# Patient Record
Sex: Male | Born: 1955 | Race: Black or African American | Hispanic: No | Marital: Single | Smoking: Current every day smoker
Health system: Southern US, Community
[De-identification: ages and names within clinical notes are randomized; demographics above are authoritative.]

## PROBLEM LIST (undated history)

## (undated) DIAGNOSIS — I10 Essential (primary) hypertension: Secondary | ICD-10-CM

## (undated) DIAGNOSIS — H409 Unspecified glaucoma: Secondary | ICD-10-CM

## (undated) HISTORY — DX: Unspecified glaucoma: H40.9

## (undated) HISTORY — PX: EYE SURGERY: SHX253

---

## 2001-05-16 ENCOUNTER — Encounter: Admission: RE | Admit: 2001-05-16 | Discharge: 2001-05-16 | Payer: Self-pay | Admitting: Family Medicine

## 2001-05-16 ENCOUNTER — Encounter: Payer: Self-pay | Admitting: Family Medicine

## 2002-09-03 ENCOUNTER — Encounter: Payer: Self-pay | Admitting: Family Medicine

## 2002-09-03 ENCOUNTER — Encounter: Admission: RE | Admit: 2002-09-03 | Discharge: 2002-09-03 | Payer: Self-pay | Admitting: Family Medicine

## 2011-02-24 ENCOUNTER — Inpatient Hospital Stay (INDEPENDENT_AMBULATORY_CARE_PROVIDER_SITE_OTHER)
Admission: RE | Admit: 2011-02-24 | Discharge: 2011-02-24 | Disposition: A | Discharge status: Home / Self Care | Payer: Self-pay | Source: Ambulatory Visit | Attending: Family Medicine | Admitting: Family Medicine

## 2011-02-24 DIAGNOSIS — K047 Periapical abscess without sinus: Secondary | ICD-10-CM

## 2016-09-01 ENCOUNTER — Emergency Department (HOSPITAL_COMMUNITY): Payer: No Typology Code available for payment source

## 2016-09-01 ENCOUNTER — Emergency Department (HOSPITAL_COMMUNITY)
Admission: EM | Admit: 2016-09-01 | Discharge: 2016-09-01 | Disposition: A | Discharge status: Home / Self Care | Payer: No Typology Code available for payment source | Attending: Emergency Medicine | Admitting: Emergency Medicine

## 2016-09-01 ENCOUNTER — Encounter (HOSPITAL_COMMUNITY): Payer: Self-pay | Admitting: *Deleted

## 2016-09-01 DIAGNOSIS — I10 Essential (primary) hypertension: Secondary | ICD-10-CM | POA: Diagnosis not present

## 2016-09-01 DIAGNOSIS — F17200 Nicotine dependence, unspecified, uncomplicated: Secondary | ICD-10-CM | POA: Insufficient documentation

## 2016-09-01 DIAGNOSIS — Y939 Activity, unspecified: Secondary | ICD-10-CM | POA: Insufficient documentation

## 2016-09-01 DIAGNOSIS — G44311 Acute post-traumatic headache, intractable: Secondary | ICD-10-CM

## 2016-09-01 DIAGNOSIS — Y999 Unspecified external cause status: Secondary | ICD-10-CM | POA: Insufficient documentation

## 2016-09-01 DIAGNOSIS — S0990XA Unspecified injury of head, initial encounter: Secondary | ICD-10-CM | POA: Diagnosis present

## 2016-09-01 DIAGNOSIS — Y92410 Unspecified street and highway as the place of occurrence of the external cause: Secondary | ICD-10-CM | POA: Insufficient documentation

## 2016-09-01 HISTORY — DX: Essential (primary) hypertension: I10

## 2016-09-01 MED ORDER — ACETAMINOPHEN 325 MG PO TABS
650.0000 mg | ORAL_TABLET | Freq: Once | ORAL | Status: AC
  Administered 2016-09-01: 650 mg via ORAL
  Filled 2016-09-01: qty 2
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
## 2016-09-01 NOTE — ED Notes (Signed)
Pt aware of d/c instruction. Signature pad not working.

## 2016-09-01 NOTE — Progress Notes (Signed)
Orthopedic Tech Progress Note Patient Details:  Thomas James 09-04-1955 [id number]  Ortho Devices Type of Ortho Device: Knee Sleeve Ortho Device/Splint Location: RLE Ortho Device/Splint Interventions: Ordered, Application   Braulio Bosch 09/01/2016, 7:24 PM
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
## 2016-09-01 NOTE — ED Notes (Signed)
Pt stable, understands discharge instructions, and reasons for return.   

## 2017-05-04 DEATH — deceased

## 2017-05-30 ENCOUNTER — Other Ambulatory Visit: Payer: Self-pay | Admitting: Family Medicine

## 2017-05-30 ENCOUNTER — Ambulatory Visit
Admission: RE | Admit: 2017-05-30 | Discharge: 2017-05-30 | Disposition: A | Discharge status: Home / Self Care | Payer: Managed Care, Other (non HMO) | Source: Ambulatory Visit | Attending: Family Medicine | Admitting: Family Medicine

## 2017-05-30 DIAGNOSIS — L97522 Non-pressure chronic ulcer of other part of left foot with fat layer exposed: Secondary | ICD-10-CM

## 2019-07-23 ENCOUNTER — Ambulatory Visit: Payer: Medicare Other

## 2019-07-27 ENCOUNTER — Ambulatory Visit: Payer: Medicare Other | Attending: Family

## 2019-07-27 DIAGNOSIS — Z23 Encounter for immunization: Secondary | ICD-10-CM | POA: Insufficient documentation

## 2019-07-27 NOTE — Progress Notes (Signed)
   Covid-19 Vaccination Clinic  Name:  Thomas James    MRN: [id number] DOB: 13-Dec-1955  07/27/2019  Thomas James was observed post Covid-19 immunization for 15 minutes without incidence. He was provided with Vaccine Information Sheet and instruction to access the V-Safe system.   Thomas James was instructed to call 911 with any severe reactions post vaccine: Marland Kitchen Difficulty breathing  . Swelling of your face and throat  . A fast heartbeat  . A bad rash all over your body  . Dizziness and weakness    Immunizations Administered    Name Date Dose VIS Date Route   Moderna COVID-19 Vaccine 07/27/2019 11:00 AM 0.5 mL 05/05/2019 Intramuscular   Manufacturer: Moderna   Lot: [id number]   [id number]

## 2019-08-13 DIAGNOSIS — M109 Gout, unspecified: Secondary | ICD-10-CM | POA: Diagnosis not present

## 2019-08-13 DIAGNOSIS — D170 Benign lipomatous neoplasm of skin and subcutaneous tissue of head, face and neck: Secondary | ICD-10-CM | POA: Diagnosis not present

## 2019-08-13 DIAGNOSIS — L853 Xerosis cutis: Secondary | ICD-10-CM | POA: Diagnosis not present

## 2019-08-13 DIAGNOSIS — E782 Mixed hyperlipidemia: Secondary | ICD-10-CM | POA: Diagnosis not present

## 2019-08-13 DIAGNOSIS — I10 Essential (primary) hypertension: Secondary | ICD-10-CM | POA: Diagnosis not present

## 2019-08-13 DIAGNOSIS — J302 Other seasonal allergic rhinitis: Secondary | ICD-10-CM | POA: Diagnosis not present

## 2019-08-13 DIAGNOSIS — M179 Osteoarthritis of knee, unspecified: Secondary | ICD-10-CM | POA: Diagnosis not present

## 2019-08-13 DIAGNOSIS — N529 Male erectile dysfunction, unspecified: Secondary | ICD-10-CM | POA: Diagnosis not present

## 2019-08-25 ENCOUNTER — Ambulatory Visit: Payer: Medicare HMO | Attending: Family

## 2019-08-25 DIAGNOSIS — Z23 Encounter for immunization: Secondary | ICD-10-CM

## 2019-08-25 NOTE — Progress Notes (Signed)
   Covid-19 Vaccination Clinic  Name:  Thomas James    MRN: [id number] DOB: November 21, 1955  08/25/2019  [name] was observed post Covid-19 immunization for 15 minutes without incident. He was provided with Vaccine Information Sheet and instruction to access the V-Safe system.   [name] was instructed to call 911 with any severe reactions post vaccine: Marland Kitchen Difficulty breathing  . Swelling of face and throat  . A fast heartbeat  . A bad rash all over body  . Dizziness and weakness   Immunizations Administered    Name Date Dose VIS Date Route   Moderna COVID-19 Vaccine 08/25/2019  1:43 PM 0.5 mL 05/05/2019 Intramuscular   Manufacturer: Moderna   LotHQ:[id number]   [place]net[place]DW:[id number]

## 2019-08-28 DIAGNOSIS — H5010 Unspecified exotropia: Secondary | ICD-10-CM | POA: Diagnosis not present

## 2019-08-28 DIAGNOSIS — H31011 Macula scars of posterior pole (postinflammatory) (post-traumatic), right eye: Secondary | ICD-10-CM | POA: Diagnosis not present

## 2019-08-28 DIAGNOSIS — H35372 Puckering of macula, left eye: Secondary | ICD-10-CM | POA: Diagnosis not present

## 2019-08-28 DIAGNOSIS — H401134 Primary open-angle glaucoma, bilateral, indeterminate stage: Secondary | ICD-10-CM | POA: Diagnosis not present

## 2020-02-15 DIAGNOSIS — Z136 Encounter for screening for cardiovascular disorders: Secondary | ICD-10-CM | POA: Diagnosis not present

## 2020-02-15 DIAGNOSIS — Z23 Encounter for immunization: Secondary | ICD-10-CM | POA: Diagnosis not present

## 2020-02-15 DIAGNOSIS — Z1389 Encounter for screening for other disorder: Secondary | ICD-10-CM | POA: Diagnosis not present

## 2020-02-15 DIAGNOSIS — J302 Other seasonal allergic rhinitis: Secondary | ICD-10-CM | POA: Diagnosis not present

## 2020-02-15 DIAGNOSIS — Z0000 Encounter for general adult medical examination without abnormal findings: Secondary | ICD-10-CM | POA: Diagnosis not present

## 2020-02-15 DIAGNOSIS — E782 Mixed hyperlipidemia: Secondary | ICD-10-CM | POA: Diagnosis not present

## 2020-02-15 DIAGNOSIS — Z125 Encounter for screening for malignant neoplasm of prostate: Secondary | ICD-10-CM | POA: Diagnosis not present

## 2020-02-15 DIAGNOSIS — I10 Essential (primary) hypertension: Secondary | ICD-10-CM | POA: Diagnosis not present

## 2020-02-15 DIAGNOSIS — M109 Gout, unspecified: Secondary | ICD-10-CM | POA: Diagnosis not present

## 2020-02-15 DIAGNOSIS — H409 Unspecified glaucoma: Secondary | ICD-10-CM | POA: Diagnosis not present

## 2020-02-15 DIAGNOSIS — M179 Osteoarthritis of knee, unspecified: Secondary | ICD-10-CM | POA: Diagnosis not present

## 2020-02-15 DIAGNOSIS — N529 Male erectile dysfunction, unspecified: Secondary | ICD-10-CM | POA: Diagnosis not present

## 2020-02-18 DIAGNOSIS — Z1211 Encounter for screening for malignant neoplasm of colon: Secondary | ICD-10-CM | POA: Diagnosis not present

## 2020-02-26 DIAGNOSIS — Z136 Encounter for screening for cardiovascular disorders: Secondary | ICD-10-CM | POA: Diagnosis not present

## 2020-02-26 DIAGNOSIS — Z87891 Personal history of nicotine dependence: Secondary | ICD-10-CM | POA: Diagnosis not present

## 2020-03-02 ENCOUNTER — Other Ambulatory Visit: Payer: Self-pay | Admitting: *Deleted

## 2020-03-02 DIAGNOSIS — Z87891 Personal history of nicotine dependence: Secondary | ICD-10-CM

## 2020-03-02 DIAGNOSIS — F17210 Nicotine dependence, cigarettes, uncomplicated: Secondary | ICD-10-CM

## 2020-03-23 ENCOUNTER — Encounter: Payer: Self-pay | Admitting: Acute Care

## 2020-03-23 ENCOUNTER — Ambulatory Visit (INDEPENDENT_AMBULATORY_CARE_PROVIDER_SITE_OTHER)
Admission: RE | Admit: 2020-03-23 | Discharge: 2020-03-23 | Disposition: A | Discharge status: Home / Self Care | Payer: Medicare HMO | Source: Ambulatory Visit | Attending: Acute Care | Admitting: Acute Care

## 2020-03-23 ENCOUNTER — Ambulatory Visit (INDEPENDENT_AMBULATORY_CARE_PROVIDER_SITE_OTHER): Payer: Medicare HMO | Admitting: Acute Care

## 2020-03-23 ENCOUNTER — Other Ambulatory Visit: Payer: Self-pay

## 2020-03-23 VITALS — BP 124/78 | HR 77 | Temp 97.3°F | Ht 69.5 in | Wt 250.4 lb

## 2020-03-23 DIAGNOSIS — Z87891 Personal history of nicotine dependence: Secondary | ICD-10-CM

## 2020-03-23 DIAGNOSIS — F17210 Nicotine dependence, cigarettes, uncomplicated: Secondary | ICD-10-CM

## 2020-03-23 DIAGNOSIS — Z122 Encounter for screening for malignant neoplasm of respiratory organs: Secondary | ICD-10-CM

## 2020-03-23 NOTE — Patient Instructions (Signed)
Thank you for participating in the [facility]. It was our pleasure to meet you today. We will call you with the results of your scan within the next few days. Your scan will be assigned a Lung RADS category score by the physicians reading the scans.  This Lung RADS score determines follow up scanning.  See below for description of categories, and follow up screening recommendations. We will be in touch to schedule your follow up screening annually or based on recommendations of our providers. We will fax a copy of your scan results to your Primary Care Physician, or the physician who referred you to the program, to ensure they have the results. Please call the office if you have any questions or concerns regarding your scanning experience or results.  Our office number is [phone]. Please speak with Denise Phelps, RN. She is our Lung Cancer Screening RN. If she is unavailable when you call, please have the office staff send her a message. She will return your call at her earliest convenience. Remember, if your scan is normal, we will scan you annually as long as you continue to meet the criteria for the program. (Age 55-77, Current smoker or smoker who has quit within the last 15 years). If you are a smoker, remember, quitting is the single most powerful action that you can take to decrease your risk of lung cancer and other pulmonary, breathing related problems. We know quitting is hard, and we are here to help.  Please let us know if there is anything we can do to help you meet your goal of quitting. If you are a former smoker, congratulations. We are proud of you! Remain smoke free! Remember you can refer friends or family members through the number above.  We will screen them to make sure they meet criteria for the program. Thank you for helping us take better care of you by participating in Lung Screening.  Lung RADS Categories:  Lung RADS 1: no nodules  or definitely non-concerning nodules.  Recommendation is for a repeat annual scan in 12 months.  Lung RADS 2:  nodules that are non-concerning in appearance and behavior with a very low likelihood of becoming an active cancer. Recommendation is for a repeat annual scan in 12 months.  Lung RADS 3: nodules that are probably non-concerning , includes nodules with a low likelihood of becoming an active cancer.  Recommendation is for a 6-month repeat screening scan. Often noted after an upper respiratory illness. We will be in touch to make sure you have no questions, and to schedule your 6-month scan.  Lung RADS 4 A: nodules with concerning findings, recommendation is most often for a follow up scan in 3 months or additional testing based on our provider's assessment of the scan. We will be in touch to make sure you have no questions and to schedule the recommended 3 month follow up scan.  Lung RADS 4 B:  indicates findings that are concerning. We will be in touch with you to schedule additional diagnostic testing based on our provider's  assessment of the scan.   

## 2020-03-23 NOTE — Progress Notes (Signed)
Shared Decision Making Visit Lung Cancer Screening Program [phone])   Eligibility:  Age 64 y.o.  Pack Years Smoking History Calculation 42 pack year smoking history (# packs/per year x # years smoked)  Recent History of coughing up blood  no  Unexplained weight loss? no ( >Than 15 pounds within the last 6 months )  Prior History Lung / other cancer no (Diagnosis within the last 5 years already requiring surveillance chest CT Scans).  Smoking Status Former Smoker  Former Smokers: Years since quit: < 1 year  Quit Date: 03/23/2020  Visit Components:  Discussion included one or more decision making aids. yes  Discussion included risk/benefits of screening. yes  Discussion included potential follow up diagnostic testing for abnormal scans. yes  Discussion included meaning and risk of over diagnosis. yes  Discussion included meaning and risk of False Positives. yes  Discussion included meaning of total radiation exposure. yes  Counseling Included:  Importance of adherence to annual lung cancer LDCT screening. yes  Impact of comorbidities on ability to participate in the program. yes  Ability and willingness to under diagnostic treatment. yes  Smoking Cessation Counseling:  Current Smokers:   Discussed importance of smoking cessation. yes  Information about tobacco cessation classes and interventions provided to patient. yes  Patient provided with "ticket" for LDCT Scan. yes  Symptomatic Patient. no  CounselingNA  Diagnosis Code: Tobacco Use Z72.0  Asymptomatic Patient yes  Counseling (Intermediate counseling: > three minutes counseling) E1740  Former Smokers:   Discussed the importance of maintaining cigarette abstinence. yes  Diagnosis Code: Personal History of Nicotine Dependence. C14.481  Information about tobacco cessation classes and interventions provided to patient. Yes  Patient provided with "ticket" for LDCT Scan. yes  Written Order for Lung  Cancer Screening with LDCT placed in Epic. Yes (CT Chest Lung Cancer Screening Low Dose W/O CM) [id number] Z12.2-Screening of respiratory organs Z87.891-Personal history of nicotine dependence  BP 124/78 (BP Location: Left Arm, Cuff Size: Normal)   Pulse 77   Temp (!) 97.3 F (36.3 C) (Oral)   Ht 5' 9.5" (1.765 m)   Wt 250 lb 6.4 oz (113.6 kg)   SpO2 99%   BMI 36.45 kg/m    I spent 25 minutes of face to face time with Thomas James discussing the risks and benefits of lung cancer screening. We viewed a power point together that explained in detail the above noted topics. We took the time to pause the power point at intervals to allow for questions to be asked and answered to ensure understanding. We discussed that he had taken the single most powerful action possible to decrease his risk of developing lung cancer when he quit smoking. I counseled him to remain smoke free, and to contact me if he ever had the desire to smoke again so that I can provide resources and tools to help support the effort to remain smoke free. We discussed the time and location of the scan, and that either  Doroteo Glassman RN or I will call with the results within  24-48 hours of receiving them. He has my card and contact information in the event he needs to speak with me, in addition to a copy of the power point we reviewed as a resource. He verbalized understanding of all of the above and had no further questions upon leaving the office.     I explained to the patient that there has been a high incidence of coronary artery disease noted on these exams.  I explained that this is a non-gated exam therefore degree or severity cannot be determined. This patient is not on statin therapy. I have asked the patient to follow-up with their PCP regarding any incidental finding of coronary artery disease and management with diet or medication as they feel is clinically indicated. The patient verbalized understanding of the above and had no  further questions.     Magdalen Spatz, NP 03/23/2020 10:03 AM

## 2020-03-31 NOTE — Progress Notes (Signed)
Please call patient and let them  know their  low dose Ct was read as a Lung RADS 2: nodules that are benign in appearance and behavior with a very low likelihood of becoming a clinically active cancer due to size or lack of growth. Recommendation per radiology is for a repeat LDCT in 12 months. .Please let them  know we will order and schedule their  annual screening scan for 03/2021 Please let them  know there was notation of CAD on their  scan.  Please remind the patient  that this is a non-gated exam therefore degree or severity of disease  cannot be determined. Please have them  follow up with their PCP regarding potential risk factor modification, dietary therapy or pharmacologic therapy if clinically indicated. Pt.  is not  currently on statin therapy. Please place order for annual  screening scan for  03/2021 and fax results to PCP. Thanks so much.  Please have him follow up with his PCP about the findings of CAD on the scan. Thanks so much

## 2020-04-01 ENCOUNTER — Other Ambulatory Visit: Payer: Self-pay | Admitting: *Deleted

## 2020-04-01 DIAGNOSIS — Z87891 Personal history of nicotine dependence: Secondary | ICD-10-CM

## 2020-04-11 DIAGNOSIS — I700 Atherosclerosis of aorta: Secondary | ICD-10-CM | POA: Diagnosis not present

## 2020-04-11 DIAGNOSIS — E782 Mixed hyperlipidemia: Secondary | ICD-10-CM | POA: Diagnosis not present

## 2020-04-11 DIAGNOSIS — J439 Emphysema, unspecified: Secondary | ICD-10-CM | POA: Diagnosis not present

## 2020-04-11 DIAGNOSIS — I10 Essential (primary) hypertension: Secondary | ICD-10-CM | POA: Diagnosis not present

## 2020-04-25 DIAGNOSIS — H2703 Aphakia, bilateral: Secondary | ICD-10-CM | POA: Diagnosis not present

## 2020-04-25 DIAGNOSIS — H401134 Primary open-angle glaucoma, bilateral, indeterminate stage: Secondary | ICD-10-CM | POA: Diagnosis not present

## 2020-11-03 DIAGNOSIS — M25461 Effusion, right knee: Secondary | ICD-10-CM | POA: Diagnosis not present

## 2020-12-30 DIAGNOSIS — M25461 Effusion, right knee: Secondary | ICD-10-CM | POA: Diagnosis not present

## 2021-01-10 DIAGNOSIS — M2550 Pain in unspecified joint: Secondary | ICD-10-CM | POA: Diagnosis not present

## 2021-01-10 DIAGNOSIS — M79672 Pain in left foot: Secondary | ICD-10-CM | POA: Diagnosis not present

## 2021-01-10 DIAGNOSIS — M1990 Unspecified osteoarthritis, unspecified site: Secondary | ICD-10-CM | POA: Diagnosis not present

## 2021-01-10 DIAGNOSIS — M25461 Effusion, right knee: Secondary | ICD-10-CM | POA: Diagnosis not present

## 2021-01-10 DIAGNOSIS — M79642 Pain in left hand: Secondary | ICD-10-CM | POA: Diagnosis not present

## 2021-01-10 DIAGNOSIS — M79641 Pain in right hand: Secondary | ICD-10-CM | POA: Diagnosis not present

## 2021-01-10 DIAGNOSIS — M79671 Pain in right foot: Secondary | ICD-10-CM | POA: Diagnosis not present

## 2021-01-10 DIAGNOSIS — M25562 Pain in left knee: Secondary | ICD-10-CM | POA: Diagnosis not present

## 2021-01-10 DIAGNOSIS — M25561 Pain in right knee: Secondary | ICD-10-CM | POA: Diagnosis not present

## 2021-01-10 DIAGNOSIS — M109 Gout, unspecified: Secondary | ICD-10-CM | POA: Diagnosis not present

## 2021-01-26 DIAGNOSIS — M25561 Pain in right knee: Secondary | ICD-10-CM | POA: Diagnosis not present

## 2021-01-26 DIAGNOSIS — M109 Gout, unspecified: Secondary | ICD-10-CM | POA: Diagnosis not present

## 2021-01-26 DIAGNOSIS — M25461 Effusion, right knee: Secondary | ICD-10-CM | POA: Diagnosis not present

## 2021-01-26 DIAGNOSIS — M25562 Pain in left knee: Secondary | ICD-10-CM | POA: Diagnosis not present

## 2021-01-26 DIAGNOSIS — R768 Other specified abnormal immunological findings in serum: Secondary | ICD-10-CM | POA: Diagnosis not present

## 2021-01-26 DIAGNOSIS — M1990 Unspecified osteoarthritis, unspecified site: Secondary | ICD-10-CM | POA: Diagnosis not present

## 2021-01-31 ENCOUNTER — Other Ambulatory Visit: Payer: Self-pay | Admitting: Rheumatology

## 2021-01-31 DIAGNOSIS — M25561 Pain in right knee: Secondary | ICD-10-CM

## 2021-01-31 DIAGNOSIS — M25461 Effusion, right knee: Secondary | ICD-10-CM

## 2021-02-23 ENCOUNTER — Ambulatory Visit
Admission: RE | Admit: 2021-02-23 | Discharge: 2021-02-23 | Disposition: A | Discharge status: Home / Self Care | Payer: Medicare HMO | Source: Ambulatory Visit | Attending: Rheumatology | Admitting: Rheumatology

## 2021-02-23 ENCOUNTER — Other Ambulatory Visit: Payer: Self-pay

## 2021-02-23 DIAGNOSIS — N529 Male erectile dysfunction, unspecified: Secondary | ICD-10-CM | POA: Diagnosis not present

## 2021-02-23 DIAGNOSIS — M109 Gout, unspecified: Secondary | ICD-10-CM | POA: Diagnosis not present

## 2021-02-23 DIAGNOSIS — Z125 Encounter for screening for malignant neoplasm of prostate: Secondary | ICD-10-CM | POA: Diagnosis not present

## 2021-02-23 DIAGNOSIS — Z23 Encounter for immunization: Secondary | ICD-10-CM | POA: Diagnosis not present

## 2021-02-23 DIAGNOSIS — I10 Essential (primary) hypertension: Secondary | ICD-10-CM | POA: Diagnosis not present

## 2021-02-23 DIAGNOSIS — I700 Atherosclerosis of aorta: Secondary | ICD-10-CM | POA: Diagnosis not present

## 2021-02-23 DIAGNOSIS — M25561 Pain in right knee: Secondary | ICD-10-CM

## 2021-02-23 DIAGNOSIS — M23321 Other meniscus derangements, posterior horn of medial meniscus, right knee: Secondary | ICD-10-CM | POA: Diagnosis not present

## 2021-02-23 DIAGNOSIS — R229 Localized swelling, mass and lump, unspecified: Secondary | ICD-10-CM | POA: Diagnosis not present

## 2021-02-23 DIAGNOSIS — J439 Emphysema, unspecified: Secondary | ICD-10-CM | POA: Diagnosis not present

## 2021-02-23 DIAGNOSIS — Z0000 Encounter for general adult medical examination without abnormal findings: Secondary | ICD-10-CM | POA: Diagnosis not present

## 2021-02-23 DIAGNOSIS — M25461 Effusion, right knee: Secondary | ICD-10-CM

## 2021-02-23 DIAGNOSIS — E782 Mixed hyperlipidemia: Secondary | ICD-10-CM | POA: Diagnosis not present

## 2021-02-23 DIAGNOSIS — R600 Localized edema: Secondary | ICD-10-CM | POA: Diagnosis not present

## 2021-03-07 DIAGNOSIS — Z961 Presence of intraocular lens: Secondary | ICD-10-CM | POA: Diagnosis not present

## 2021-03-07 DIAGNOSIS — H35373 Puckering of macula, bilateral: Secondary | ICD-10-CM | POA: Diagnosis not present

## 2021-03-07 DIAGNOSIS — H31091 Other chorioretinal scars, right eye: Secondary | ICD-10-CM | POA: Diagnosis not present

## 2021-03-07 DIAGNOSIS — H2701 Aphakia, right eye: Secondary | ICD-10-CM | POA: Diagnosis not present

## 2021-03-07 DIAGNOSIS — H401132 Primary open-angle glaucoma, bilateral, moderate stage: Secondary | ICD-10-CM | POA: Diagnosis not present

## 2021-03-07 DIAGNOSIS — H5201 Hypermetropia, right eye: Secondary | ICD-10-CM | POA: Diagnosis not present

## 2021-03-15 DIAGNOSIS — Z1211 Encounter for screening for malignant neoplasm of colon: Secondary | ICD-10-CM | POA: Diagnosis not present

## 2021-03-21 DIAGNOSIS — M1711 Unilateral primary osteoarthritis, right knee: Secondary | ICD-10-CM | POA: Diagnosis not present

## 2021-03-21 DIAGNOSIS — M25561 Pain in right knee: Secondary | ICD-10-CM | POA: Diagnosis not present

## 2021-03-28 DIAGNOSIS — H5213 Myopia, bilateral: Secondary | ICD-10-CM | POA: Diagnosis not present

## 2021-03-28 DIAGNOSIS — H5203 Hypermetropia, bilateral: Secondary | ICD-10-CM | POA: Diagnosis not present

## 2021-04-04 DIAGNOSIS — M1711 Unilateral primary osteoarthritis, right knee: Secondary | ICD-10-CM | POA: Diagnosis not present

## 2021-07-31 ENCOUNTER — Telehealth: Payer: Self-pay | Admitting: Acute Care

## 2021-07-31 NOTE — Telephone Encounter (Signed)
Left message for pt to call to schedule f/u lung screening CT scan.

## 2021-08-18 DIAGNOSIS — M1711 Unilateral primary osteoarthritis, right knee: Secondary | ICD-10-CM | POA: Diagnosis not present

## 2021-12-25 DIAGNOSIS — M1711 Unilateral primary osteoarthritis, right knee: Secondary | ICD-10-CM | POA: Diagnosis not present

## 2022-01-22 DIAGNOSIS — Z6835 Body mass index (BMI) 35.0-35.9, adult: Secondary | ICD-10-CM | POA: Diagnosis not present

## 2022-01-22 DIAGNOSIS — Z0000 Encounter for general adult medical examination without abnormal findings: Secondary | ICD-10-CM | POA: Diagnosis not present

## 2022-01-22 DIAGNOSIS — I10 Essential (primary) hypertension: Secondary | ICD-10-CM | POA: Diagnosis not present

## 2022-01-22 DIAGNOSIS — Z125 Encounter for screening for malignant neoplasm of prostate: Secondary | ICD-10-CM | POA: Diagnosis not present

## 2022-01-22 DIAGNOSIS — Z1159 Encounter for screening for other viral diseases: Secondary | ICD-10-CM | POA: Diagnosis not present

## 2022-01-22 DIAGNOSIS — E559 Vitamin D deficiency, unspecified: Secondary | ICD-10-CM | POA: Diagnosis not present

## 2022-01-22 DIAGNOSIS — M170 Bilateral primary osteoarthritis of knee: Secondary | ICD-10-CM | POA: Diagnosis not present

## 2022-01-22 DIAGNOSIS — Z79899 Other long term (current) drug therapy: Secondary | ICD-10-CM | POA: Diagnosis not present

## 2022-01-30 DIAGNOSIS — Z23 Encounter for immunization: Secondary | ICD-10-CM | POA: Diagnosis not present

## 2022-01-30 DIAGNOSIS — J439 Emphysema, unspecified: Secondary | ICD-10-CM | POA: Diagnosis not present

## 2022-01-30 DIAGNOSIS — L989 Disorder of the skin and subcutaneous tissue, unspecified: Secondary | ICD-10-CM | POA: Diagnosis not present

## 2022-01-30 DIAGNOSIS — Z0001 Encounter for general adult medical examination with abnormal findings: Secondary | ICD-10-CM | POA: Diagnosis not present

## 2022-01-30 DIAGNOSIS — I700 Atherosclerosis of aorta: Secondary | ICD-10-CM | POA: Diagnosis not present

## 2022-01-30 DIAGNOSIS — M170 Bilateral primary osteoarthritis of knee: Secondary | ICD-10-CM | POA: Diagnosis not present

## 2022-01-30 DIAGNOSIS — I10 Essential (primary) hypertension: Secondary | ICD-10-CM | POA: Diagnosis not present

## 2022-01-30 DIAGNOSIS — H409 Unspecified glaucoma: Secondary | ICD-10-CM | POA: Diagnosis not present

## 2022-02-08 DIAGNOSIS — Z1211 Encounter for screening for malignant neoplasm of colon: Secondary | ICD-10-CM | POA: Diagnosis not present

## 2022-02-12 ENCOUNTER — Telehealth: Payer: Self-pay | Admitting: *Deleted

## 2022-02-12 NOTE — Telephone Encounter (Signed)
Left message for patient to call back to schedule follow up lung cancer screening CT scan.  

## 2022-03-02 DIAGNOSIS — Z961 Presence of intraocular lens: Secondary | ICD-10-CM | POA: Diagnosis not present

## 2022-03-02 DIAGNOSIS — H401134 Primary open-angle glaucoma, bilateral, indeterminate stage: Secondary | ICD-10-CM | POA: Diagnosis not present

## 2022-03-02 DIAGNOSIS — H5231 Anisometropia: Secondary | ICD-10-CM | POA: Diagnosis not present

## 2022-05-18 ENCOUNTER — Ambulatory Visit
Admission: EM | Admit: 2022-05-18 | Discharge: 2022-05-18 | Disposition: A | Discharge status: Other Acute Inpatient Hospital | Payer: Medicare HMO

## 2022-05-18 DIAGNOSIS — M25562 Pain in left knee: Secondary | ICD-10-CM | POA: Diagnosis not present

## 2022-05-18 DIAGNOSIS — R519 Headache, unspecified: Secondary | ICD-10-CM | POA: Diagnosis not present
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
## 2022-05-18 NOTE — Discharge Instructions (Signed)
Please go to the emergency department as soon as you leave urgent care for further evaluation and management. ?
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
## 2022-05-19 ENCOUNTER — Emergency Department (HOSPITAL_BASED_OUTPATIENT_CLINIC_OR_DEPARTMENT_OTHER): Payer: Medicare HMO

## 2022-05-19 ENCOUNTER — Other Ambulatory Visit: Payer: Self-pay

## 2022-05-19 ENCOUNTER — Emergency Department (HOSPITAL_BASED_OUTPATIENT_CLINIC_OR_DEPARTMENT_OTHER)
Admission: EM | Admit: 2022-05-19 | Discharge: 2022-05-19 | Disposition: A | Discharge status: Home / Self Care | Payer: Medicare HMO | Attending: Emergency Medicine | Admitting: Emergency Medicine

## 2022-05-19 ENCOUNTER — Encounter (HOSPITAL_BASED_OUTPATIENT_CLINIC_OR_DEPARTMENT_OTHER): Payer: Self-pay | Admitting: Emergency Medicine

## 2022-05-19 DIAGNOSIS — S8002XA Contusion of left knee, initial encounter: Secondary | ICD-10-CM | POA: Diagnosis not present

## 2022-05-19 DIAGNOSIS — S161XXA Strain of muscle, fascia and tendon at neck level, initial encounter: Secondary | ICD-10-CM

## 2022-05-19 DIAGNOSIS — R519 Headache, unspecified: Secondary | ICD-10-CM | POA: Insufficient documentation

## 2022-05-19 DIAGNOSIS — Y92410 Unspecified street and highway as the place of occurrence of the external cause: Secondary | ICD-10-CM | POA: Insufficient documentation

## 2022-05-19 DIAGNOSIS — S39012A Strain of muscle, fascia and tendon of lower back, initial encounter: Secondary | ICD-10-CM | POA: Diagnosis not present

## 2022-05-19 DIAGNOSIS — I10 Essential (primary) hypertension: Secondary | ICD-10-CM | POA: Diagnosis not present

## 2022-05-19 DIAGNOSIS — S199XXA Unspecified injury of neck, initial encounter: Secondary | ICD-10-CM | POA: Diagnosis present

## 2022-05-19 MED ORDER — METHOCARBAMOL 500 MG PO TABS: 500.0000 mg | ORAL_TABLET | Freq: Three times a day (TID) | ORAL | 0 refills | Status: AC | PRN

## 2022-05-19 MED ORDER — DICLOFENAC SODIUM 1 % EX GEL: 2.0000 g | Freq: Four times a day (QID) | CUTANEOUS | 0 refills | Status: AC | PRN
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
## 2022-12-12 ENCOUNTER — Encounter (HOSPITAL_COMMUNITY): Payer: Self-pay | Admitting: Emergency Medicine

## 2022-12-12 ENCOUNTER — Emergency Department (HOSPITAL_COMMUNITY)
Admission: EM | Admit: 2022-12-12 | Discharge: 2022-12-12 | Disposition: A | Discharge status: Home / Self Care | Payer: Medicare HMO | Attending: Emergency Medicine | Admitting: Emergency Medicine

## 2022-12-12 ENCOUNTER — Other Ambulatory Visit: Payer: Self-pay

## 2022-12-12 ENCOUNTER — Emergency Department (HOSPITAL_COMMUNITY): Payer: Medicare HMO

## 2022-12-12 DIAGNOSIS — W1809XA Striking against other object with subsequent fall, initial encounter: Secondary | ICD-10-CM | POA: Insufficient documentation

## 2022-12-12 DIAGNOSIS — Z79899 Other long term (current) drug therapy: Secondary | ICD-10-CM | POA: Insufficient documentation

## 2022-12-12 DIAGNOSIS — I10 Essential (primary) hypertension: Secondary | ICD-10-CM | POA: Insufficient documentation

## 2022-12-12 DIAGNOSIS — H1132 Conjunctival hemorrhage, left eye: Secondary | ICD-10-CM | POA: Diagnosis not present

## 2022-12-12 DIAGNOSIS — S0083XA Contusion of other part of head, initial encounter: Secondary | ICD-10-CM | POA: Diagnosis not present

## 2022-12-12 DIAGNOSIS — S0990XA Unspecified injury of head, initial encounter: Secondary | ICD-10-CM | POA: Diagnosis present

## 2022-12-12 MED ORDER — TETRACAINE HCL 0.5 % OP SOLN
2.0000 [drp] | Freq: Once | OPHTHALMIC | Status: AC
  Administered 2022-12-12: 2 [drp] via OPHTHALMIC
  Filled 2022-12-12: qty 4

## 2022-12-12 MED ORDER — FLUORESCEIN SODIUM 1 MG OP STRP
1.0000 | ORAL_STRIP | Freq: Once | OPHTHALMIC | Status: AC
  Administered 2022-12-12: 1 via OPHTHALMIC
  Filled 2022-12-12: qty 1

## 2022-12-12 NOTE — Discharge Instructions (Signed)
Follow-up with your eye doctor in the next couple days for reevaluation.  Return for worsening vision changes or follow-up sooner.
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
## 2022-12-12 NOTE — ED Provider Notes (Signed)
[facility] Provider Note   CSN: [id number] Arrival date & time: 12/12/22  0865     History  Chief Complaint  Patient presents with   Thomas James    Thomas James is a 67 y.o. male.   Fall  Patient presents with fall.  Hit left eye last night.  Head on concrete.  Now with some dizziness.  Some pain on the left eye.  Good vision in the left eye.  Does have some conjunctival hemorrhage laterally.  Eye movements intact.  No neck pain.    Past Medical History:  Diagnosis Date   Glaucoma    Hypertension     Home Medications Prior to Admission medications   Medication Sig Start Date End Date Taking? Authorizing Provider  amLODipine (NORVASC) 10 MG tablet  01/11/20   [provider]  diclofenac Sodium (VOLTAREN) 1 % GEL Apply 2 g topically 4 (four) times daily as needed. 05/19/22   Long, Arlyss Repress, MD  dorzolamide-timolol (COSOPT) 22.3-6.8 MG/ML ophthalmic solution Place 1 drop into both eyes 2 times daily for 30 days. 11/11/19   [provider]  methocarbamol (ROBAXIN) 500 MG tablet Take 1 tablet (500 mg total) by mouth every 8 (eight) hours as needed for muscle spasms. 05/19/22   Long, Arlyss Repress, MD      Allergies    Other    Review of Systems   Review of Systems  Physical Exam Updated Vital Signs BP (!) 153/97 (BP Location: Left Arm)   Pulse 92   Temp 98.3 F (36.8 C) (Oral)   Resp 18   Ht 5' 9.5" (1.765 m)   Wt 110 kg   SpO2 100%   BMI 35.30 kg/m  Physical Exam Vitals reviewed.  HENT:     Head:     Comments: Hematoma on forehead. Eyes:     Extraocular Movements: Extraocular movements intact.     Pupils: Pupils are equal, round, and reactive to light.     Comments: Subconjunctival hemorrhage on left.  No corneal uptake on fluorescein exam.  Chest:     Chest wall: No tenderness.  Abdominal:     Tenderness: There is no abdominal tenderness.  Musculoskeletal:     Cervical back: Neck supple. No tenderness.   Skin:    General: Skin is warm.  Neurological:     Mental Status: He is alert and oriented to person, place, and time.     ED Results / Procedures / Treatments   Labs (all labs ordered are listed, but only abnormal results are displayed) Labs Reviewed - No data to display  EKG None  Radiology CT HEAD WO CONTRAST ( )  Result Date: 12/12/2022 CLINICAL DATA:  Fall last night, hit head. Redness and pain to left eye. EXAM: CT HEAD AND ORBITS WITHOUT CONTRAST TECHNIQUE: Contiguous axial images were obtained from the base of the skull through the vertex without contrast. Multidetector CT imaging of the orbits was performed using the standard protocol without intravenous contrast. RADIATION DOSE REDUCTION: This exam was performed according to the departmental dose-optimization program which includes automated exposure control, adjustment of the mA and/or kV according to patient size and/or use of iterative reconstruction technique. COMPARISON:  CT head 05/19/2022 FINDINGS: CT HEAD FINDINGS Brain: There is no acute intracranial hemorrhage, extra-axial fluid collection, or acute infarct. Parenchymal volume is normal. The ventricles are normal in size. Gray-white differentiation is preserved The pituitary and suprasellar region are normal. There is no mass lesion.  There is no mass effect or midline shift. Vascular: There is calcification of the bilateral carotid siphons. Skull: Normal. Negative for fracture or focal lesion. Other: There is mild midline frontal scalp swelling. The mastoid air cells and middle ear cavities are clear. CT ORBITS FINDINGS Orbits: Bilateral lens implants and glaucoma valve are in place. The globes are intact, without evidence of traumatic injury. There is no retrobulbar hematoma on either side. The extraocular muscles, orbital fat, optic nerves, and lacrimal glands are normal. Visualized sinuses: Clear. Soft tissues: Unremarkable. IMPRESSION: 1. No acute intracranial pathology  or traumatic injury to the globes/orbits. 2. Mild midline frontal scalp swelling without calvarial fracture. Electronically Signed   By: Lesia Hausen M.D.   On: 12/12/2022 08:38   CT Orbits Wo Contrast  Result Date: 12/12/2022 CLINICAL DATA:  Fall last night, hit head. Redness and pain to left eye. EXAM: CT HEAD AND ORBITS WITHOUT CONTRAST TECHNIQUE: Contiguous axial images were obtained from the base of the skull through the vertex without contrast. Multidetector CT imaging of the orbits was performed using the standard protocol without intravenous contrast. RADIATION DOSE REDUCTION: This exam was performed according to the departmental dose-optimization program which includes automated exposure control, adjustment of the mA and/or kV according to patient size and/or use of iterative reconstruction technique. COMPARISON:  CT head 05/19/2022 FINDINGS: CT HEAD FINDINGS Brain: There is no acute intracranial hemorrhage, extra-axial fluid collection, or acute infarct. Parenchymal volume is normal. The ventricles are normal in size. Gray-white differentiation is preserved The pituitary and suprasellar region are normal. There is no mass lesion. There is no mass effect or midline shift. Vascular: There is calcification of the bilateral carotid siphons. Skull: Normal. Negative for fracture or focal lesion. Other: There is mild midline frontal scalp swelling. The mastoid air cells and middle ear cavities are clear. CT ORBITS FINDINGS Orbits: Bilateral lens implants and glaucoma valve are in place. The globes are intact, without evidence of traumatic injury. There is no retrobulbar hematoma on either side. The extraocular muscles, orbital fat, optic nerves, and lacrimal glands are normal. Visualized sinuses: Clear. Soft tissues: Unremarkable. IMPRESSION: 1. No acute intracranial pathology or traumatic injury to the globes/orbits. 2. Mild midline frontal scalp swelling without calvarial fracture. Electronically Signed    By: Lesia Hausen M.D.   On: 12/12/2022 08:38    Procedures Procedures    Medications Ordered in ED Medications  tetracaine (PONTOCAINE) 0.5 % ophthalmic solution 2 drop (has no administration in time range)  fluorescein ophthalmic strip 1 strip (has no administration in time range)    ED Course/ Medical Decision Making/ A&P                             Medical Decision Making Amount and/or Complexity of Data Reviewed Radiology: ordered.  Risk Prescription drug management.   Patient with fall.  Hit head.  Has hematoma on forehead with some orbital findings.  Subconjunctival hemorrhage.  Unable to check intraocular pressure due to broken Tono-Pen.  However pupils are reactive.  Good vision.  Has an ophthalmologist he can see.  Also given our ophthalmology for follow-up as needed.  Likely has head injury.  Doubt other causes such as vertebral dissection for the dizziness.  Appears stable for discharge home.        Final Clinical Impression(s) / ED Diagnoses Final diagnoses:  Injury of head, initial encounter  Subconjunctival hemorrhage of left eye    Rx /  DC Orders ED Discharge Orders     None         Benjiman Core, MD 12/12/22 1003

## 2022-12-18 IMAGING — MR MR KNEE*R* W/O CM
6 series · 39 of 40 positions shown · non-contrast
Comparison: RADIOGRAPHY OF THE KNEE DATED 09/03/2002.

CLINICAL DATA: Initial exam for chronic and increasing general knee
pain. NKI. Pt feels it may be arthritis.

EXAM:
MRI OF THE RIGHT KNEE WITHOUT CONTRAST
TECHNIQUE: Multiplanar, multisequence MR imaging of the knee was performed. No
intravenous contrast was administered.

[Series 5: T2 fat-sat · axial · right · 4.0mm · 0.50mm/px · z∈[-53,+100]mm · 8 of 36 slices shown (1 of 3)]
[im 1/36]
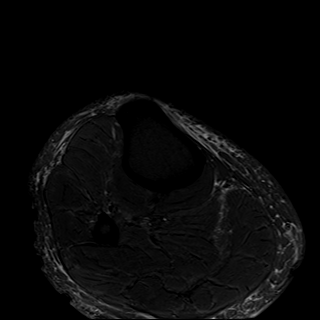
[im 6/36]
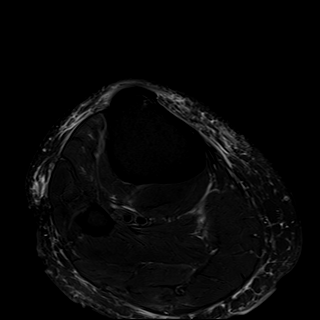
[im 11/36]
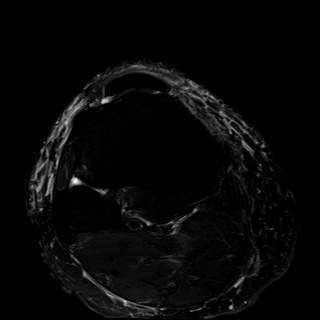
[im 16/36]
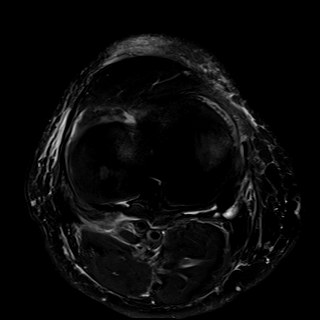
[im 21/36]
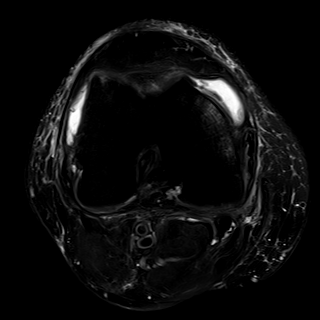
[im 26/36]
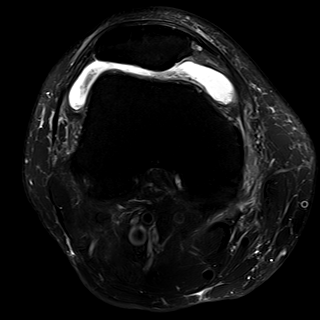
[im 31/36]
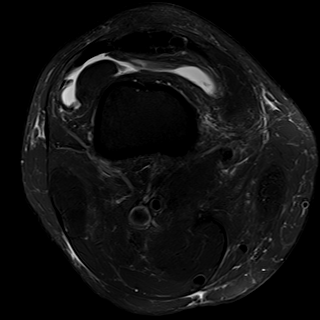
[im 36/36]
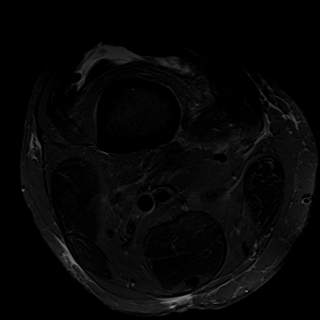

[Series 6: T2 fat-sat · coronal · right · 4.0mm · 0.47mm/px · 7 of 30 slices shown (2 of 3)]
[im 1/30]
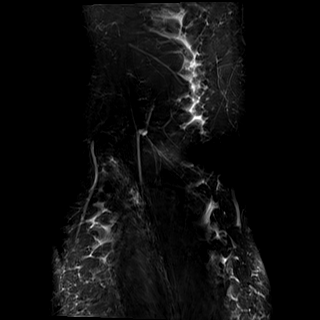
[im 5/30]
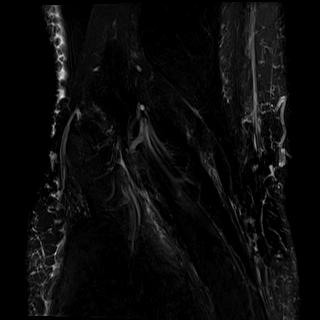
[im 10/30]
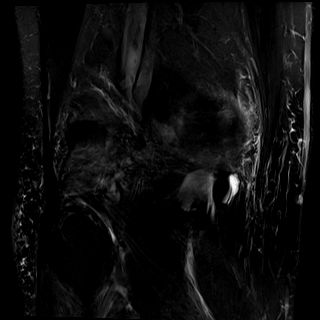
[im 15/30]
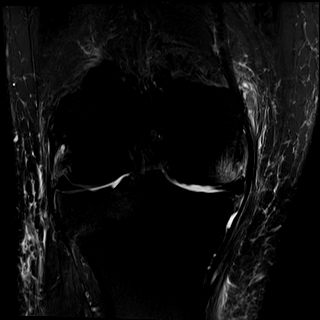
[im 20/30]
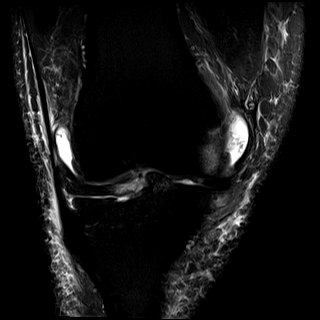
[im 25/30]
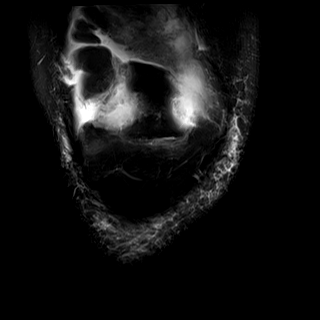
[im 30/30]
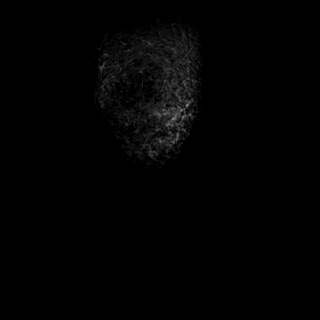

[Series 7: T1 · coronal · right · 4.0mm · 0.47mm/px · 3 of 15 slices shown]
[im 1/15]
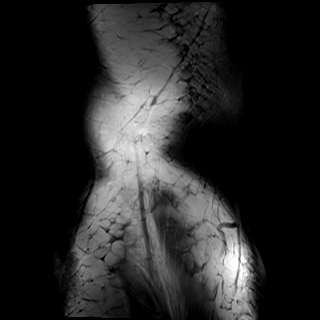
[im 5/15]
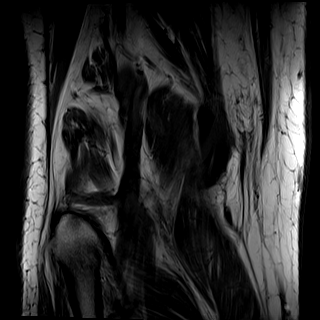
[im 10/15]
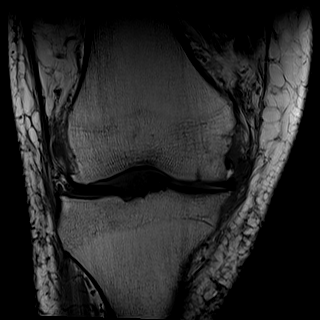

[Series 8: PD fat-sat · coronal · right · 3.0mm · 0.47mm/px · 7 of 28 slices shown (1 of 2)]
[im 1/28]
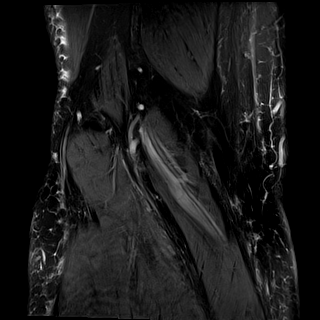
[im 5/28]
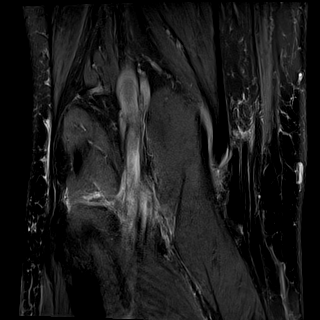
[im 10/28]
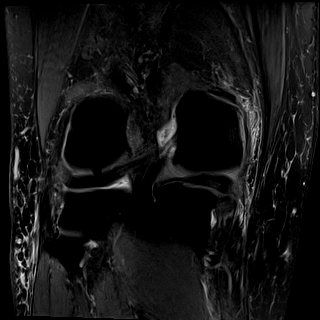
[im 14/28]
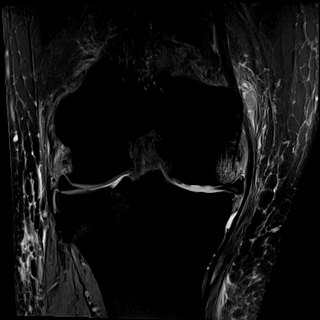
[im 19/28]
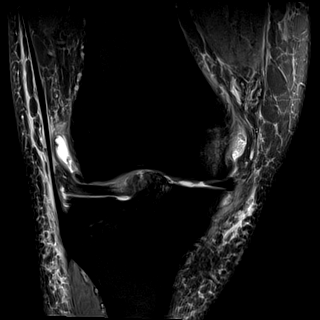
[im 23/28]
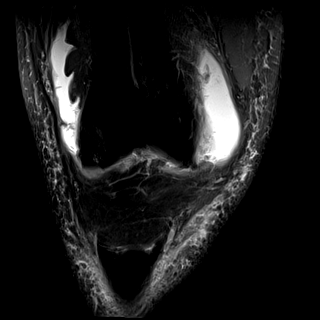
[im 28/28]
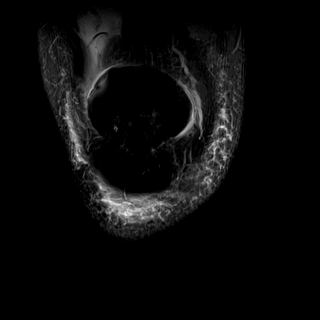

[Series 9: PD fat-sat · sagittal · right · 3.0mm · 0.39mm/px · 7 of 30 slices shown (2 of 2)]
[im 1/30]
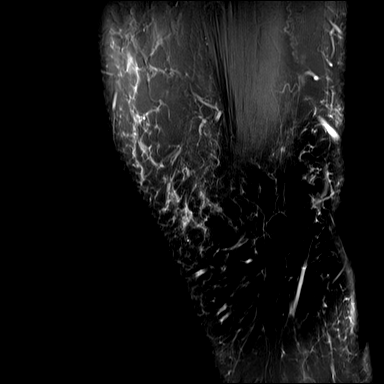
[im 5/30]
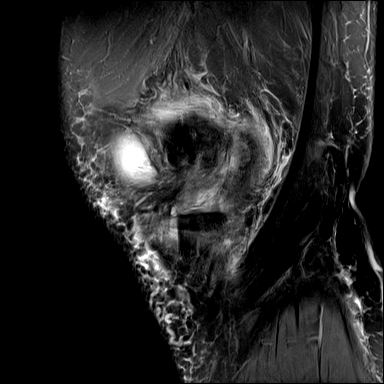
[im 10/30]
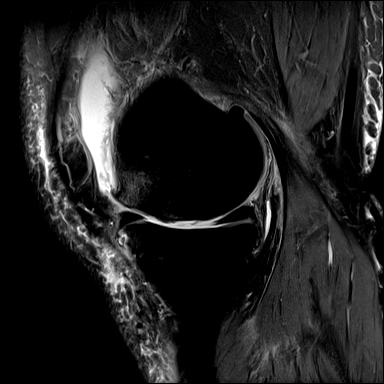
[im 15/30]
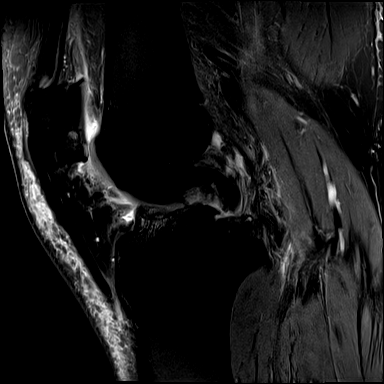
[im 20/30]
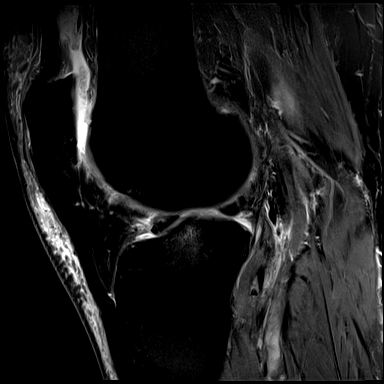
[im 25/30]
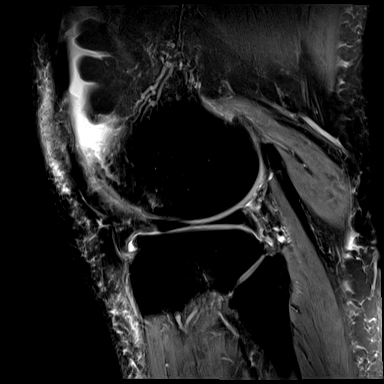
[im 30/30]
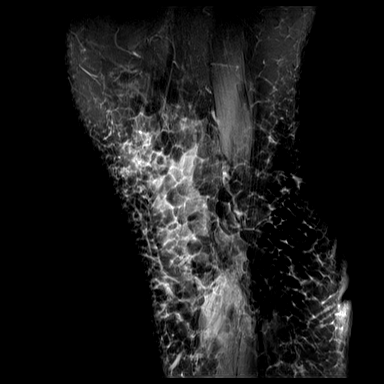

[Series 10: T2 fat-sat · sagittal · right · 3.0mm · 0.39mm/px · 7 of 30 slices shown (3 of 3)]
[im 1/30]
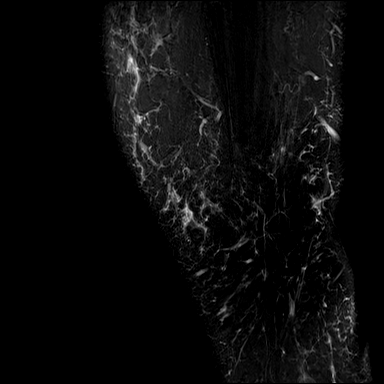
[im 5/30]
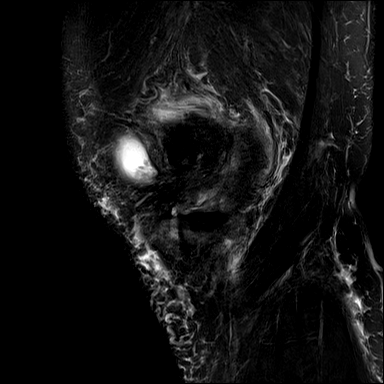
[im 10/30]
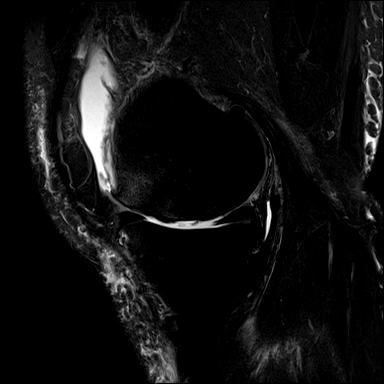
[im 15/30]
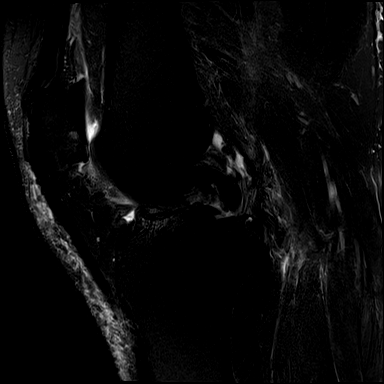
[im 20/30]
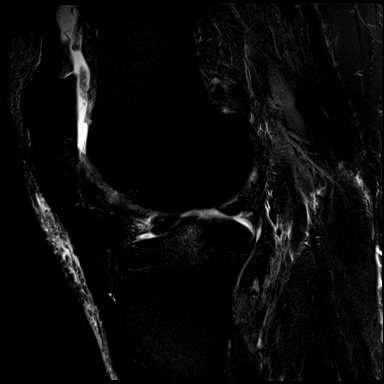
[im 25/30]
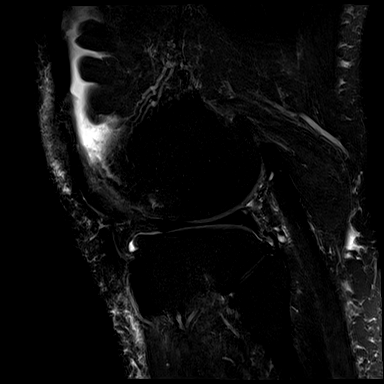
[im 30/30]
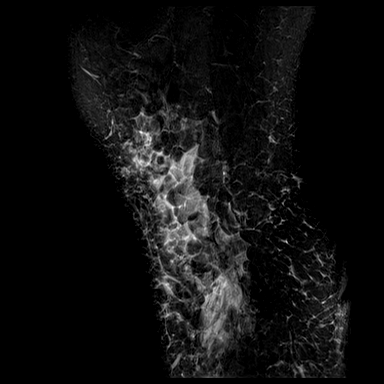

[39 of 40 positions shown; findings below may reference images not displayed]

FINDINGS: MENISCI

Medial: Radial tear of the posterior horn of the medial meniscus
towards the meniscal root with peripheral meniscal extrusion.
Degeneration of the posterior horn of the medial meniscus.

Lateral: Fraying along the free edge of the posterior horn of the
lateral meniscus.

LIGAMENTS

Cruciates: Intact ACL and PCL. ACL is expanded and increased in
signal consistent with mucinous degeneration.

Collaterals: Medial collateral ligament is intact. Lateral
collateral ligament complex is intact.

CARTILAGE

Patellofemoral: Full-thickness cartilage loss of the lateral
patellar facet. High-grade partial-thickness cartilage loss of the
medial patellar facet. Partial-thickness cartilage loss of the
trochlea with areas of high-grade partial-thickness cartilage loss
of the lateral trochlea with subchondral reactive marrow changes.

Medial: Full-thickness cartilage loss of the medial femorotibial
compartment with subchondral reactive marrow edema.

Lateral: Large areas of full-thickness cartilage loss of the lateral
tibial plateau with subchondral reactive marrow edema.
Partial-thickness cartilage loss of the lateral femoral condyle.

JOINT: Large joint effusion. Normal Shintaku Buddika. No plical
thickening.

POPLITEAL FOSSA: Popliteus tendon is intact. No Baker's cyst.

EXTENSOR MECHANISM: Intact quadriceps tendon. Intact patellar
tendon. Intact lateral patellar retinaculum. Intact medial patellar
retinaculum. Intact MPFL.

BONES: No aggressive osseous lesion. No fracture or dislocation.

Other: No fluid collection or hematoma. Muscles are normal. Small
amount of fluid in the semimembranosus bursa.
IMPRESSION: 1. Radial tear of the posterior horn of the medial meniscus towards
the meniscal root with peripheral meniscal extrusion. Degeneration
of the posterior horn of the medial meniscus.
2. Fraying along the free edge of the posterior horn of the lateral
meniscus.
3. Severe tricompartmental cartilage abnormalities as described
above.

## 2023-04-12 ENCOUNTER — Encounter (HOSPITAL_BASED_OUTPATIENT_CLINIC_OR_DEPARTMENT_OTHER): Payer: Self-pay

## 2023-04-12 ENCOUNTER — Other Ambulatory Visit (HOSPITAL_BASED_OUTPATIENT_CLINIC_OR_DEPARTMENT_OTHER): Payer: Self-pay

## 2023-04-12 ENCOUNTER — Emergency Department (HOSPITAL_BASED_OUTPATIENT_CLINIC_OR_DEPARTMENT_OTHER)
Admission: EM | Admit: 2023-04-12 | Discharge: 2023-04-12 | Disposition: A | Discharge status: Home / Self Care | Payer: Medicare HMO | Attending: Emergency Medicine | Admitting: Emergency Medicine

## 2023-04-12 ENCOUNTER — Other Ambulatory Visit: Payer: Self-pay

## 2023-04-12 DIAGNOSIS — K029 Dental caries, unspecified: Secondary | ICD-10-CM | POA: Diagnosis not present

## 2023-04-12 DIAGNOSIS — R220 Localized swelling, mass and lump, head: Secondary | ICD-10-CM | POA: Diagnosis present

## 2023-04-12 MED ORDER — OXYCODONE HCL 5 MG PO TABS
5.0000 mg | ORAL_TABLET | Freq: Four times a day (QID) | ORAL | 0 refills | Status: AC | PRN
  Filled 2023-04-12: qty 8, 2d supply, fill #0

## 2023-04-12 MED ORDER — AMOXICILLIN-POT CLAVULANATE 875-125 MG PO TABS
1.0000 | ORAL_TABLET | Freq: Two times a day (BID) | ORAL | 0 refills | Status: AC
  Filled 2023-04-12: qty 14, 7d supply, fill #0

## 2023-04-12 MED ORDER — AMOXICILLIN-POT CLAVULANATE 875-125 MG PO TABS
1.0000 | ORAL_TABLET | Freq: Once | ORAL | Status: AC
  Administered 2023-04-12: 1 via ORAL
  Filled 2023-04-12: qty 1

## 2023-04-12 NOTE — ED Triage Notes (Signed)
The patient had an abscessed tooth removed two days ago. He now has pain and swelling to the left side of his face. No fever. He stated he has been taking antibiotics as prescribed.

## 2023-04-12 NOTE — Discharge Instructions (Signed)
Please read and follow all provided instructions.  Your diagnoses today include:  1. Facial swelling    The exam and treatment you received today has been provided on an emergency basis only. This is not a substitute for complete medical or dental care.  Tests performed today include: Vital signs. See below for your results today.   Medications prescribed:  Augmentin - antibiotic  You have been prescribed an antibiotic medicine: take the entire course of medicine even if you are feeling better. Stopping early can cause the antibiotic not to work.  Oxycodone - narcotic pain medication  DO NOT drive or perform any activities that require you to be awake and alert because this medicine can make you drowsy.   Take any prescribed medications only as directed.  Home care instructions:  Follow any educational materials contained in this packet.  Please stop taking the previously prescribed amoxicillin and take the Augmentin which is a combination of amoxicillin and clavulanate.  Follow-up instructions: Please follow-up with your dentist on Monday for recheck  Return instructions:  Please return to the Emergency Department if you experience worsening symptoms. Please return if you develop a fever, you develop more swelling in your face or neck, you have trouble breathing or swallowing food. Please return if you have any other emergent concerns.  Additional Information:  Your vital signs today were: BP (!) 167/86   Pulse 79   Temp 98.7 F (37.1 C) (Oral)   Resp 16   Ht 5\' 9"  (1.753 m)   Wt 110 kg   SpO2 96%   BMI 35.81 kg/m  If your blood pressure (BP) was elevated above 135/85 this visit, please have this repeated by your doctor within one month. --------------

## 2023-04-12 NOTE — ED Provider Notes (Signed)
[facility] Provider Note   CSN: [id number] Arrival date & time: 04/12/23  1047     History  Chief Complaint  Patient presents with   Dental Pain   Facial Swelling    Thomas James is a 67 y.o. male.  Patient presents to the emergency department today for evaluation of left-sided facial swelling.  Patient developed a dental infection several days ago.  He was seen by a dentist 2 days ago.  He was started on Augmentin and also had one of his lower left molars extracted.  He has taken the amoxicillin for about 2 days.  He was given oral Toradol for pain which she has been taking at home.  This has caused his stomach to be upset, but does control the pain somewhat until it wears off.  He had much difficulty sleeping last night due to left-sided facial pain.  He has had associated swelling.  He is able to open his mouth.  He is able to swallow but states that his throat is mildly sore.  No voice changes.  No fevers.  No history of diabetes or other immunocompromising conditions.       Home Medications Prior to Admission medications   Medication Sig Start Date End Date Taking? Authorizing Provider  amLODipine (NORVASC) 10 MG tablet  01/11/20   [provider]  diclofenac Sodium (VOLTAREN) 1 % GEL Apply 2 g topically 4 (four) times daily as needed. 05/19/22   Long, Arlyss Repress, MD  dorzolamide-timolol (COSOPT) 22.3-6.8 MG/ML ophthalmic solution Place 1 drop into both eyes 2 times daily for 30 days. 11/11/19   [provider]  methocarbamol (ROBAXIN) 500 MG tablet Take 1 tablet (500 mg total) by mouth every 8 (eight) hours as needed for muscle spasms. 05/19/22   Long, Arlyss Repress, MD      Allergies    Other    Review of Systems   Review of Systems  Physical Exam Updated Vital Signs BP (!) 167/86   Pulse 79   Temp 98.7 F (37.1 C) (Oral)   Resp 16   Ht 5\' 9"  (1.753 m)   Wt 110 kg   SpO2 96%   BMI 35.81 kg/m  Physical  Exam Vitals and nursing note reviewed.  Constitutional:      Appearance: He is well-developed.  HENT:     Head: Normocephalic and atraumatic.     Jaw: No trismus.     Right Ear: Tympanic membrane, ear canal and external ear normal.     Left Ear: Tympanic membrane, ear canal and external ear normal.     Nose: Nose normal.     Mouth/Throat:     Dentition: Abnormal dentition. Dental caries present. No dental abscesses.     Pharynx: Uvula midline. No uvula swelling.     Tonsils: No tonsillar abscesses.     Comments: Patient with recent dental extraction noted in left lower jaw either tooth #17 or 18.  Preop tooth #19 was previously extracted.  Patient does have a mild amount of firmness without fluctuance in the lower left mandible.  No trismus.  No overlying erythema or warmth.  Mild facial swelling noted. Eyes:     Pupils: Pupils are equal, round, and reactive to light.  Neck:     Comments: No neck swelling or Lugwig's angina Musculoskeletal:     Cervical back: Normal range of motion and neck supple.  Skin:    General: Skin is warm and  dry.  Neurological:     Mental Status: He is alert.  Psychiatric:        Mood and Affect: Mood normal.     ED Results / Procedures / Treatments   Labs (all labs ordered are listed, but only abnormal results are displayed) Labs Reviewed - No data to display  EKG None  Radiology No results found.  Procedures Procedures    Medications Ordered in ED Medications  amoxicillin-clavulanate (AUGMENTIN) 875-125 MG per tablet 1 tablet (has no administration in time range)    ED Course/ Medical Decision Making/ A&P    Patient seen and examined. History obtained directly from patient and family member at bedside.   Labs/EKG: None ordered  Imaging: None ordered  Medications/Fluids: Ordered: Oral Augmentin, patient is driving home and declines pain medication here but would like prescription  Most recent vital signs reviewed and are as  follows: BP (!) 167/86   Pulse 79   Temp 98.7 F (37.1 C) (Oral)   Resp 16   Ht 5\' 9"  (1.753 m)   Wt 110 kg   SpO2 96%   BMI 35.81 kg/m   Initial impression: Postoperative pain, left-sided dental infection  I had a shared decision-making discussion with patient and wife at bedside.  We discussed possible options including broadening antibiotics and better pain control versus additional workup today with lab testing and imaging.  At this time they are comfortable with broadening antibiotics.  They seem comfortable with returning if symptoms do worsen.  We discussed return instructions as below.  Home treatment plan: Will broaden antibiotics from amoxicillin to amoxicillin/clavulanic acid, will prescribe # 8 tablets oxycodone 5 mg to use at home for pain over the weekend.  He may continue p.o. Toradol if desired.    Return instructions discussed with patient: We discussed return to the emergency department with fever, worsening pain or swelling, trouble swallowing, trouble breathing.   Follow-up instructions discussed with patient: Encouraged follow-up with dentist early next week, in about 3 days, for recheck.                                Medical Decision Making Risk Prescription drug management.   Patient with continued facial swelling and pain after dental extraction 48 hours ago.  There is the potential for a small abscess, but is not fluctuant or easily accessible.  Patient does not have significant signs concerning for deep space neck infection.  No trismus, he has normal range of motion, no fevers.  No history of diabetes or immunocompromise.  Do not suspect Ludwig's angina at this time.  If symptoms continue to worsen, despite treatments, imaging would be warranted, but after discussion with patient they agreed to defer today.        Final Clinical Impression(s) / ED Diagnoses Final diagnoses:  Facial swelling    Rx / DC Orders ED Discharge Orders          Ordered     amoxicillin-clavulanate (AUGMENTIN) 875-125 MG tablet  Every 12 hours        04/12/23 1348    oxyCODONE (OXY IR/ROXICODONE) 5 MG immediate release tablet  Every 6 hours PRN        04/12/23 1348              Renne Crigler, PA-C 04/12/23 1356    Lonell Grandchild, MD 04/12/23 1418

## 2023-09-05 ENCOUNTER — Encounter: Payer: Self-pay | Admitting: Acute Care

## 2023-10-18 ENCOUNTER — Other Ambulatory Visit: Payer: Self-pay | Admitting: Family Medicine

## 2023-10-18 DIAGNOSIS — Z720 Tobacco use: Secondary | ICD-10-CM
